# Patient Record
Sex: Male | Born: 1985 | Race: Black or African American | Hispanic: No | Marital: Married | Smoking: Current every day smoker
Health system: Southern US, Community
[De-identification: ages and names within clinical notes are randomized; demographics above are authoritative.]

## PROBLEM LIST (undated history)

## (undated) HISTORY — PX: TONSILLECTOMY: SUR1361

---

## 2010-11-01 ENCOUNTER — Emergency Department: Payer: Self-pay | Admitting: Emergency Medicine

## 2011-02-20 ENCOUNTER — Emergency Department: Payer: Self-pay | Admitting: Emergency Medicine

## 2011-04-05 ENCOUNTER — Emergency Department: Payer: Self-pay | Admitting: Emergency Medicine

## 2012-12-28 ENCOUNTER — Emergency Department: Payer: Self-pay | Admitting: Emergency Medicine

## 2012-12-28 LAB — URINALYSIS, COMPLETE
Bacteria: NONE SEEN
Leukocyte Esterase: NEGATIVE
Protein: NEGATIVE
Specific Gravity: 1.024 (ref 1.003–1.030)
Squamous Epithelial: 1
WBC UR: 2 /HPF (ref 0–5)

## 2012-12-28 LAB — CBC
HCT: 46.7 % (ref 40.0–52.0)
HGB: 15.5 g/dL (ref 13.0–18.0)
MCHC: 33.1 g/dL (ref 32.0–36.0)
Platelet: 205 10*3/uL (ref 150–440)
RBC: 5.49 10*6/uL (ref 4.40–5.90)
WBC: 11.6 10*3/uL — ABNORMAL HIGH (ref 3.8–10.6)

## 2012-12-28 LAB — HEPATIC FUNCTION PANEL A (ARMC)
Alkaline Phosphatase: 85 U/L (ref 50–136)
Bilirubin, Direct: <0.1 mg/dL (ref 0.00–0.20)
Bilirubin,Total: 0.3 mg/dL (ref 0.2–1.0)
SGPT (ALT): 33 U/L (ref 12–78)
Total Protein: 7.7 g/dL (ref 6.4–8.2)

## 2012-12-28 LAB — ACETAMINOPHEN LEVEL: Acetaminophen: <2 ug/mL

## 2012-12-28 LAB — BASIC METABOLIC PANEL
BUN: 9 mg/dL (ref 7–18)
Calcium, Total: 9.2 mg/dL (ref 8.5–10.1)
Chloride: 110 mmol/L — ABNORMAL HIGH (ref 98–107)
EGFR (African American): >60
EGFR (Non-African Amer.): >60
Glucose: 138 mg/dL — ABNORMAL HIGH (ref 65–99)
Potassium: 3.4 mmol/L — ABNORMAL LOW (ref 3.5–5.1)
Sodium: 138 mmol/L (ref 136–145)

## 2013-06-13 ENCOUNTER — Emergency Department: Payer: Self-pay | Admitting: Emergency Medicine

## 2013-07-13 ENCOUNTER — Emergency Department: Payer: Self-pay | Admitting: Emergency Medicine

## 2013-07-13 LAB — URINALYSIS, COMPLETE
Bacteria: NONE SEEN
Bilirubin,UR: NEGATIVE
Ketone: NEGATIVE
Leukocyte Esterase: NEGATIVE
Ph: 6 (ref 4.5–8.0)
RBC,UR: 42 /HPF (ref 0–5)
Squamous Epithelial: NONE SEEN
WBC UR: 1 /HPF (ref 0–5)

## 2013-07-17 ENCOUNTER — Emergency Department: Payer: Self-pay | Admitting: Internal Medicine

## 2013-07-17 LAB — URINALYSIS, COMPLETE
Bacteria: NONE SEEN
Leukocyte Esterase: NEGATIVE
Ph: 7 (ref 4.5–8.0)
Protein: NEGATIVE
RBC,UR: 12 /HPF (ref 0–5)
WBC UR: <1 /HPF (ref 0–5)

## 2013-08-18 ENCOUNTER — Emergency Department: Payer: Self-pay | Admitting: Emergency Medicine

## 2013-08-18 LAB — COMPREHENSIVE METABOLIC PANEL
ALBUMIN: 3 g/dL — AB (ref 3.4–5.0)
ALK PHOS: 64 U/L
Anion Gap: 4 — ABNORMAL LOW (ref 7–16)
BUN: 7 mg/dL (ref 7–18)
Bilirubin,Total: 0.2 mg/dL (ref 0.2–1.0)
CO2: 27 mmol/L (ref 21–32)
Calcium, Total: 8.5 mg/dL (ref 8.5–10.1)
Chloride: 109 mmol/L — ABNORMAL HIGH (ref 98–107)
Creatinine: 0.91 mg/dL (ref 0.60–1.30)
Glucose: 101 mg/dL — ABNORMAL HIGH (ref 65–99)
Osmolality: 278 (ref 275–301)
POTASSIUM: 3.9 mmol/L (ref 3.5–5.1)
SGOT(AST): 26 U/L (ref 15–37)
SGPT (ALT): 37 U/L (ref 12–78)
SODIUM: 140 mmol/L (ref 136–145)
Total Protein: 6.3 g/dL — ABNORMAL LOW (ref 6.4–8.2)

## 2013-08-18 LAB — CBC
HCT: 47.7 % (ref 40.0–52.0)
HGB: 15.8 g/dL (ref 13.0–18.0)
MCH: 28.1 pg (ref 26.0–34.0)
MCHC: 33.2 g/dL (ref 32.0–36.0)
MCV: 85 fL (ref 80–100)
Platelet: 239 10*3/uL (ref 150–440)
RBC: 5.64 10*6/uL (ref 4.40–5.90)
RDW: 14.9 % — ABNORMAL HIGH (ref 11.5–14.5)
WBC: 9.9 10*3/uL (ref 3.8–10.6)

## 2013-08-18 LAB — URINALYSIS, COMPLETE
Bilirubin,UR: NEGATIVE
Glucose,UR: NEGATIVE mg/dL (ref 0–75)
KETONE: NEGATIVE
Leukocyte Esterase: NEGATIVE
Nitrite: NEGATIVE
PH: 6 (ref 4.5–8.0)
Protein: NEGATIVE
Specific Gravity: 1.013 (ref 1.003–1.030)
Squamous Epithelial: <1
WBC UR: 2 /HPF (ref 0–5)

## 2013-08-19 ENCOUNTER — Emergency Department: Payer: Self-pay | Admitting: Emergency Medicine

## 2014-04-22 ENCOUNTER — Emergency Department: Payer: Self-pay | Admitting: Emergency Medicine

## 2014-04-22 LAB — COMPREHENSIVE METABOLIC PANEL
Albumin: 3.6 g/dL (ref 3.4–5.0)
Alkaline Phosphatase: 76 U/L
Anion Gap: 8 (ref 7–16)
BILIRUBIN TOTAL: 0.2 mg/dL (ref 0.2–1.0)
BUN: 5 mg/dL — ABNORMAL LOW (ref 7–18)
CO2: 25 mmol/L (ref 21–32)
CREATININE: 0.84 mg/dL (ref 0.60–1.30)
Calcium, Total: 8.7 mg/dL (ref 8.5–10.1)
Chloride: 108 mmol/L — ABNORMAL HIGH (ref 98–107)
EGFR (Non-African Amer.): >60
Glucose: 90 mg/dL (ref 65–99)
Osmolality: 278 (ref 275–301)
Potassium: 3.8 mmol/L (ref 3.5–5.1)
SGOT(AST): 23 U/L (ref 15–37)
SGPT (ALT): 35 U/L
Sodium: 141 mmol/L (ref 136–145)
TOTAL PROTEIN: 7.2 g/dL (ref 6.4–8.2)

## 2014-04-22 LAB — CBC WITH DIFFERENTIAL/PLATELET
Basophil #: 0 10*3/uL (ref 0.0–0.1)
Basophil %: 0.4 %
EOS PCT: 2.8 %
Eosinophil #: 0.3 10*3/uL (ref 0.0–0.7)
HCT: 42.7 % (ref 40.0–52.0)
HGB: 13.6 g/dL (ref 13.0–18.0)
LYMPHS ABS: 2.4 10*3/uL (ref 1.0–3.6)
Lymphocyte %: 22 %
MCH: 27.8 pg (ref 26.0–34.0)
MCHC: 31.8 g/dL — ABNORMAL LOW (ref 32.0–36.0)
MCV: 87 fL (ref 80–100)
MONO ABS: 0.7 x10 3/mm (ref 0.2–1.0)
Monocyte %: 6.2 %
NEUTROS PCT: 68.6 %
Neutrophil #: 7.4 10*3/uL — ABNORMAL HIGH (ref 1.4–6.5)
PLATELETS: 171 10*3/uL (ref 150–440)
RBC: 4.9 10*6/uL (ref 4.40–5.90)
RDW: 15.3 % — ABNORMAL HIGH (ref 11.5–14.5)
WBC: 10.8 10*3/uL — ABNORMAL HIGH (ref 3.8–10.6)

## 2014-04-22 LAB — URINALYSIS, COMPLETE
Bacteria: NONE SEEN
Bilirubin,UR: NEGATIVE
Blood: NEGATIVE
Glucose,UR: NEGATIVE mg/dL (ref 0–75)
Ketone: NEGATIVE
Leukocyte Esterase: NEGATIVE
Nitrite: NEGATIVE
Ph: 6 (ref 4.5–8.0)
Protein: NEGATIVE
RBC,UR: NONE SEEN /HPF (ref 0–5)
Specific Gravity: 1.006 (ref 1.003–1.030)
Squamous Epithelial: NONE SEEN
WBC UR: <1 /HPF (ref 0–5)

## 2014-04-22 LAB — TROPONIN I: Troponin-I: <0.02 ng/mL

## 2014-04-22 LAB — LIPASE, BLOOD: LIPASE: 149 U/L (ref 73–393)

## 2014-07-17 ENCOUNTER — Emergency Department: Payer: Self-pay | Admitting: Emergency Medicine

## 2019-01-24 ENCOUNTER — Emergency Department: Payer: Self-pay

## 2019-01-24 ENCOUNTER — Other Ambulatory Visit: Payer: Self-pay

## 2019-01-24 ENCOUNTER — Encounter: Payer: Self-pay | Admitting: Emergency Medicine

## 2019-01-24 ENCOUNTER — Emergency Department
Admission: EM | Admit: 2019-01-24 | Discharge: 2019-01-24 | Disposition: A | Discharge status: Home / Self Care | Payer: Self-pay | Attending: Emergency Medicine | Admitting: Emergency Medicine

## 2019-01-24 DIAGNOSIS — W231XXA Caught, crushed, jammed, or pinched between stationary objects, initial encounter: Secondary | ICD-10-CM | POA: Insufficient documentation

## 2019-01-24 DIAGNOSIS — Y9389 Activity, other specified: Secondary | ICD-10-CM | POA: Insufficient documentation

## 2019-01-24 DIAGNOSIS — B070 Plantar wart: Secondary | ICD-10-CM | POA: Insufficient documentation

## 2019-01-24 DIAGNOSIS — S8012XA Contusion of left lower leg, initial encounter: Secondary | ICD-10-CM | POA: Insufficient documentation

## 2019-01-24 DIAGNOSIS — Y929 Unspecified place or not applicable: Secondary | ICD-10-CM | POA: Insufficient documentation

## 2019-01-24 DIAGNOSIS — Y998 Other external cause status: Secondary | ICD-10-CM | POA: Insufficient documentation

## 2019-01-24 MED ORDER — TRAMADOL HCL 50 MG PO TABS: 50.0000 mg | ORAL_TABLET | Freq: Four times a day (QID) | ORAL | 0 refills | Status: AC | PRN

## 2019-01-24 MED ORDER — NAPROXEN 500 MG PO TABS: 500.0000 mg | ORAL_TABLET | Freq: Two times a day (BID) | ORAL | 0 refills | Status: AC
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
## 2019-01-24 NOTE — Discharge Instructions (Addendum)
Discontinue taking your wife's ibuprofen and begin taking naproxen 500 mg twice daily with food.  Tramadol is 1 every 6 hours as needed for pain.  This is considered a narcotic and should not be taken while driving or operating machinery.  You may use ice to your leg as needed for discomfort and elevate as needed.  Obtain the doughnut foot pad at any pharmacy or [facility] that we discussed.  This goes over the area on your foot.  If not improving you may need to see a podiatrist for further treatment.  Dr. Vickki Muff has an office in both [place] and [place].

## 2019-05-25 ENCOUNTER — Emergency Department
Admission: EM | Admit: 2019-05-25 | Discharge: 2019-05-27 | Disposition: A | Discharge status: Home / Self Care | Payer: Self-pay | Attending: Emergency Medicine | Admitting: Emergency Medicine

## 2019-05-25 ENCOUNTER — Other Ambulatory Visit: Payer: Self-pay

## 2019-05-25 ENCOUNTER — Encounter: Payer: Self-pay | Admitting: Emergency Medicine

## 2019-05-25 DIAGNOSIS — F17210 Nicotine dependence, cigarettes, uncomplicated: Secondary | ICD-10-CM | POA: Insufficient documentation

## 2019-05-25 DIAGNOSIS — F1594 Other stimulant use, unspecified with stimulant-induced mood disorder: Secondary | ICD-10-CM

## 2019-05-25 DIAGNOSIS — R45851 Suicidal ideations: Secondary | ICD-10-CM

## 2019-05-25 DIAGNOSIS — F329 Major depressive disorder, single episode, unspecified: Secondary | ICD-10-CM | POA: Insufficient documentation

## 2019-05-25 LAB — COMPREHENSIVE METABOLIC PANEL
ALT: 43 U/L (ref 0–44)
AST: 40 U/L (ref 15–41)
Albumin: 4.7 g/dL (ref 3.5–5.0)
Alkaline Phosphatase: 73 U/L (ref 38–126)
Anion gap: 10 (ref 5–15)
BUN: 15 mg/dL (ref 6–20)
CO2: 23 mmol/L (ref 22–32)
Calcium: 9.6 mg/dL (ref 8.9–10.3)
Chloride: 106 mmol/L (ref 98–111)
Creatinine, Ser: 1.05 mg/dL (ref 0.61–1.24)
GFR calc Af Amer: >60 mL/min (ref >60)
GFR calc non Af Amer: >60 mL/min (ref >60)
Glucose, Bld: 101 mg/dL — ABNORMAL HIGH (ref 70–99)
Potassium: 3.9 mmol/L (ref 3.5–5.1)
Sodium: 139 mmol/L (ref 135–145)
Total Bilirubin: 0.5 mg/dL (ref 0.3–1.2)
Total Protein: 8.2 g/dL — ABNORMAL HIGH (ref 6.5–8.1)

## 2019-05-25 LAB — CBC
HCT: 44.2 % (ref 39.0–52.0)
Hemoglobin: 14.8 g/dL (ref 13.0–17.0)
MCH: 27.6 pg (ref 26.0–34.0)
MCHC: 33.5 g/dL (ref 30.0–36.0)
MCV: 82.5 fL (ref 80.0–100.0)
Platelets: 196 10*3/uL (ref 150–400)
RBC: 5.36 MIL/uL (ref 4.22–5.81)
RDW: 15.5 % (ref 11.5–15.5)
WBC: 13.7 10*3/uL — ABNORMAL HIGH (ref 4.0–10.5)
nRBC: 0 % (ref 0.0–0.2)

## 2019-05-25 LAB — ETHANOL: Alcohol, Ethyl (B): <10 mg/dL (ref <10)

## 2019-05-25 LAB — SALICYLATE LEVEL: Salicylate Lvl: <7 mg/dL (ref 2.8–30.0)

## 2019-05-25 LAB — ACETAMINOPHEN LEVEL: Acetaminophen (Tylenol), Serum: <10 ug/mL — ABNORMAL LOW (ref 10–30)

## 2019-05-25 MED ORDER — LORAZEPAM 2 MG/ML IJ SOLN
2.0000 mg | Freq: Once | INTRAMUSCULAR | Status: AC
  Administered 2019-05-25: 2 mg via INTRAMUSCULAR
  Filled 2019-05-25: qty 1

## 2019-05-25 MED ORDER — HALOPERIDOL LACTATE 5 MG/ML IJ SOLN
10.0000 mg | Freq: Once | INTRAMUSCULAR | Status: AC
  Administered 2019-05-25: 10 mg via INTRAMUSCULAR

## 2019-05-25 MED ORDER — DIPHENHYDRAMINE HCL 50 MG/ML IJ SOLN: 50.0000 mg | Freq: Once | INTRAMUSCULAR | Status: DC

## 2019-05-25 MED ORDER — HALOPERIDOL LACTATE 5 MG/ML IJ SOLN
5.0000 mg | Freq: Once | INTRAMUSCULAR | Status: DC
  Filled 2019-05-25: qty 1

## 2019-05-25 MED ORDER — ZIPRASIDONE MESYLATE 20 MG IM SOLR: 20.0000 mg | Freq: Once | INTRAMUSCULAR | Status: DC

## 2019-05-25 MED ORDER — LORAZEPAM 2 MG/ML IJ SOLN: 2.0000 mg | Freq: Once | INTRAMUSCULAR | Status: DC

## 2019-05-25 MED ORDER — NICOTINE 21 MG/24HR TD PT24
21.0000 mg | MEDICATED_PATCH | Freq: Once | TRANSDERMAL | Status: DC
  Filled 2019-05-25 (×2): qty 1
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
## 2019-05-25 NOTE — ED Notes (Signed)
Patient is awake, no signs of distress, will continue to monitor.

## 2019-05-25 NOTE — ED Notes (Signed)
Dr. Cinda Quest went in room to assess Patient, Patient without any behavioral issues, remains sleepy, Patient is calm at this time.
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
## 2019-05-25 NOTE — ED Notes (Signed)
Pt sleeping, dinner tray not offered at this time

## 2019-05-25 NOTE — ED Notes (Signed)
Gust Rung NP ordered ativan, benadryl and Geodon to have if Patient becomes agitated and combative again as earlier. Nurse and tech will continue to monitor.
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
## 2019-05-25 NOTE — ED Notes (Signed)
Pt. Sitting on bed in room.  Pt. Calm at this time.  Pt. States large toe pain.  Pt. Has some swelling to distal part of large toe on lt. Foot.  Pt. States hx of pain to same area.
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
## 2019-05-26 ENCOUNTER — Emergency Department: Payer: Self-pay

## 2019-05-26 MED ORDER — ASENAPINE MALEATE 5 MG SL SUBL
10.0000 mg | SUBLINGUAL_TABLET | SUBLINGUAL | Status: AC
  Administered 2019-05-26: 10 mg via SUBLINGUAL
  Filled 2019-05-26: qty 2

## 2019-05-26 MED ORDER — DIPHENHYDRAMINE HCL 25 MG PO CAPS
50.0000 mg | ORAL_CAPSULE | ORAL | Status: AC
  Administered 2019-05-26: 50 mg via ORAL
  Filled 2019-05-26: qty 2

## 2019-05-26 MED ORDER — LORAZEPAM 2 MG PO TABS
2.0000 mg | ORAL_TABLET | ORAL | Status: AC
  Administered 2019-05-26: 2 mg via ORAL
  Filled 2019-05-26: qty 1

## 2019-05-26 MED ORDER — ASENAPINE MALEATE 5 MG SL SUBL
10.0000 mg | SUBLINGUAL_TABLET | Freq: Two times a day (BID) | SUBLINGUAL | Status: DC
  Administered 2019-05-27: 10 mg via SUBLINGUAL
  Filled 2019-05-26 (×4): qty 2

## 2019-05-26 MED ORDER — ASENAPINE MALEATE 5 MG SL SUBL
10.0000 mg | SUBLINGUAL_TABLET | Freq: Once | SUBLINGUAL | Status: DC
  Administered 2019-05-26: 10 mg via SUBLINGUAL
  Filled 2019-05-26: qty 2

## 2019-05-26 NOTE — ED Notes (Signed)
Pt seen by psych. Pt then screamed after seeing. Psych sending for IVC.

## 2019-05-26 NOTE — ED Notes (Signed)
Pt refusing to give urine.   Reports his urine is positive for "crack, xanax, and weed"
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
## 2019-05-26 NOTE — ED Notes (Signed)
IVC, pt continues to be IVC'd per Waylan Boga, NP, new papers on chart

## 2019-05-26 NOTE — ED Provider Notes (Signed)
-----------------------------------------   5:54 AM on 05/26/2019 -----------------------------------------   Blood pressure (!) 119/106, pulse (!) 115, temperature 98.6 F (37 C), temperature source Oral, resp. rate 16, height 5\' 9"  (1.753 m), weight 104.3 kg, SpO2 96 %.  The patient is sleeping at this time.  There have been no acute events since the last update.  Awaiting disposition plan from Behavioral Medicine and/or Social Work team(s).   Paulette Blanch, MD 05/26/19 [phone]

## 2019-05-26 NOTE — ED Notes (Signed)
Spoke to [place], NP. Hold off on covid swab for now while waiting disposition. Possible d/c if pt can calm down.

## 2019-05-26 NOTE — ED Notes (Signed)
Pt refused xray when they came to do it.

## 2019-05-26 NOTE — ED Notes (Signed)
Pt using phone at this time. Pt tearful and c/o right big toe pain. Xray ordered. Pt wanting to go home but cannot tell us where he will be going. Roselyn Reef, NP at bedside to witness this. Medications ordered.

## 2019-05-26 NOTE — ED Notes (Signed)
Pt. Has lt. Brown jeans, pair of white high-top sneakers, white undershirt and grey shirt and pair of socks.  All items put in one bag.

## 2019-05-26 NOTE — ED Notes (Signed)
Pt given meal tray. Pt got angry at RN for having to stay and threw meal tray against wall.
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
## 2019-05-26 NOTE — ED Notes (Signed)
Pt aggressive. Yelling out. PO meds ordered. Pt agreeable at this time. IVC in place. Pending SL Saphris from pharmacy.

## 2019-05-26 NOTE — ED Notes (Signed)
Pt sleeping at this time. Will give lunch tray when pt awakes.

## 2019-05-26 NOTE — ED Notes (Signed)
Pt requested nicoderm patch but refused when applying. Calm and cooperative at this time.

## 2019-05-26 NOTE — ED Notes (Signed)
Pt remains sleeping at this time. Pt has been moving around in bed but not awake. Pt breathing evenly at this time. NAD.

## 2019-05-26 NOTE — ED Notes (Signed)
Pt awake and asking when he can go home. Roselyn Reef, NP notified so pt can be reevaluated.
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
## 2019-05-26 NOTE — ED Notes (Signed)
Report given to tele psych. Pending consult.

## 2019-05-26 NOTE — ED Notes (Signed)
Pt currently asleep. Medicated per orders. Will continue to follow for escalation. Non-violent at present.

## 2019-05-26 NOTE — ED Notes (Signed)
Pt given breakfast tray

## 2019-05-26 NOTE — ED Notes (Signed)
SOC at bedside. 
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
## 2019-05-27 DIAGNOSIS — F1994 Other psychoactive substance use, unspecified with psychoactive substance-induced mood disorder: Secondary | ICD-10-CM | POA: Insufficient documentation

## 2019-05-27 MED ORDER — ACETAMINOPHEN 500 MG PO TABS
ORAL_TABLET | ORAL | Status: AC
  Administered 2019-05-27: 1000 mg via ORAL
  Filled 2019-05-27: qty 2

## 2019-05-27 MED ORDER — ACETAMINOPHEN 500 MG PO TABS
1000.0000 mg | ORAL_TABLET | Freq: Four times a day (QID) | ORAL | Status: DC | PRN
  Administered 2019-05-27: 09:00:00 1000 mg via ORAL

## 2019-05-27 NOTE — Consult Note (Signed)
  Patient reassessed on the morning of 05/27/2019.  Patient at this time is without psychiatric symptoms.  Patient denies homicidal or suicidal ideation.  Patient states that his mood is good and he is looking forward to leaving the hospital and getting going with his outpatient rehab treatment.  Patient at this time is not displaying any psychosis or mania.  Patient denies depression.     Patient is able to reasonably explain the circumstances.  He states that he did drugs which impaired his judgment.  Patient that while under the influence of drugs proceeded to take more drugs which she feels made him disinhibited and aggressive.  Patient denies feeling that way now.  Patient acknowledges that drug use are a problem for him.  Patient states that he has a rehab program that he is looking forward to attend upon leaving the hospital.  IVC rescinded.  Patient does not meet criteria for inpatient hospitalization at this time.  Discharge to community.

## 2019-05-27 NOTE — ED Notes (Signed)
Patient refuses swab at this time. States he believes he will be discharged at reeval today. Patient is clear and coherent. Agree to wait to determination of admit to swab.

## 2019-05-27 NOTE — ED Notes (Signed)
States headache decreased, resting in room with lights down.

## 2019-05-27 NOTE — ED Notes (Signed)
Alert coherent patient to lobby to wait sister to give ride home. All belongings returned to patient.

## 2019-05-27 NOTE — ED Notes (Signed)
Patient is calm, coherent and cooperative hoping to be cleared for discharge on today's evaluation.

## 2019-05-27 NOTE — ED Provider Notes (Signed)
Patient has been cleared by psychiatry for discharge.   Earleen Newport, MD 05/27/19 1031

## 2019-05-27 NOTE — ED Notes (Signed)
Pt up to the bathroom. Asking to use the phone and this RN explained the phone hours to the patient and patient is understanding.
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
